# Patient Record
Sex: Male | Born: 2000
Health system: Southern US, Community
[De-identification: ages and names within clinical notes are randomized; demographics above are authoritative.]

## PROBLEM LIST (undated history)

## (undated) DIAGNOSIS — T7840XA Allergy, unspecified, initial encounter: Secondary | ICD-10-CM

## (undated) DIAGNOSIS — J45909 Unspecified asthma, uncomplicated: Secondary | ICD-10-CM

## (undated) HISTORY — DX: Unspecified asthma, uncomplicated: J45.909

## (undated) HISTORY — DX: Allergy, unspecified, initial encounter: T78.40XA

---
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
## 2020-02-06 ENCOUNTER — Telehealth: Payer: Self-pay | Admitting: Pediatrics

## 2020-02-06 NOTE — Telephone Encounter (Signed)
Father asking for refill for inhaler since he will be gone out of the country till December. Just to hold him off until he returns.

## 2020-02-07 MED ORDER — ALBUTEROL SULFATE HFA 108 (90 BASE) MCG/ACT IN AERS: INHALATION_SPRAY | RESPIRATORY_TRACT | 12 refills | Status: DC

## 2020-02-07 NOTE — Telephone Encounter (Signed)
Refilled Allergy/asthma medications 

## 2020-02-18 ENCOUNTER — Telehealth: Payer: Self-pay | Admitting: Pediatrics

## 2020-02-24 ENCOUNTER — Ambulatory Visit (HOSPITAL_COMMUNITY)
Admission: EM | Admit: 2020-02-24 | Discharge: 2020-02-24 | Disposition: A | Discharge status: Home / Self Care | Payer: BC Managed Care – PPO | Attending: Urgent Care | Admitting: Urgent Care

## 2020-02-24 ENCOUNTER — Other Ambulatory Visit: Payer: Self-pay

## 2020-02-24 DIAGNOSIS — Z20822 Contact with and (suspected) exposure to covid-19: Secondary | ICD-10-CM | POA: Insufficient documentation

## 2020-02-24 DIAGNOSIS — Z0189 Encounter for other specified special examinations: Secondary | ICD-10-CM | POA: Diagnosis not present

## 2020-02-24 NOTE — Discharge Instructions (Signed)

## 2020-02-24 NOTE — ED Triage Notes (Signed)
Pt presents for covid test for travel. Denies any symptoms.

## 2020-02-25 LAB — SARS CORONAVIRUS 2 (TAT 6-24 HRS): SARS Coronavirus 2: NEGATIVE

## 2020-07-22 NOTE — Telephone Encounter (Signed)
Open in error

## 2020-07-30 IMAGING — CR DG CHEST 2V
2 series · 2 of 2 positions shown · non-contrast
Comparison: 06/06/2018

CLINICAL DATA: Recent pneumonia.  Evaluate for resolution

EXAM:
CHEST - 2 VIEW

[w chest pa 8-[id] (15-22cm)]
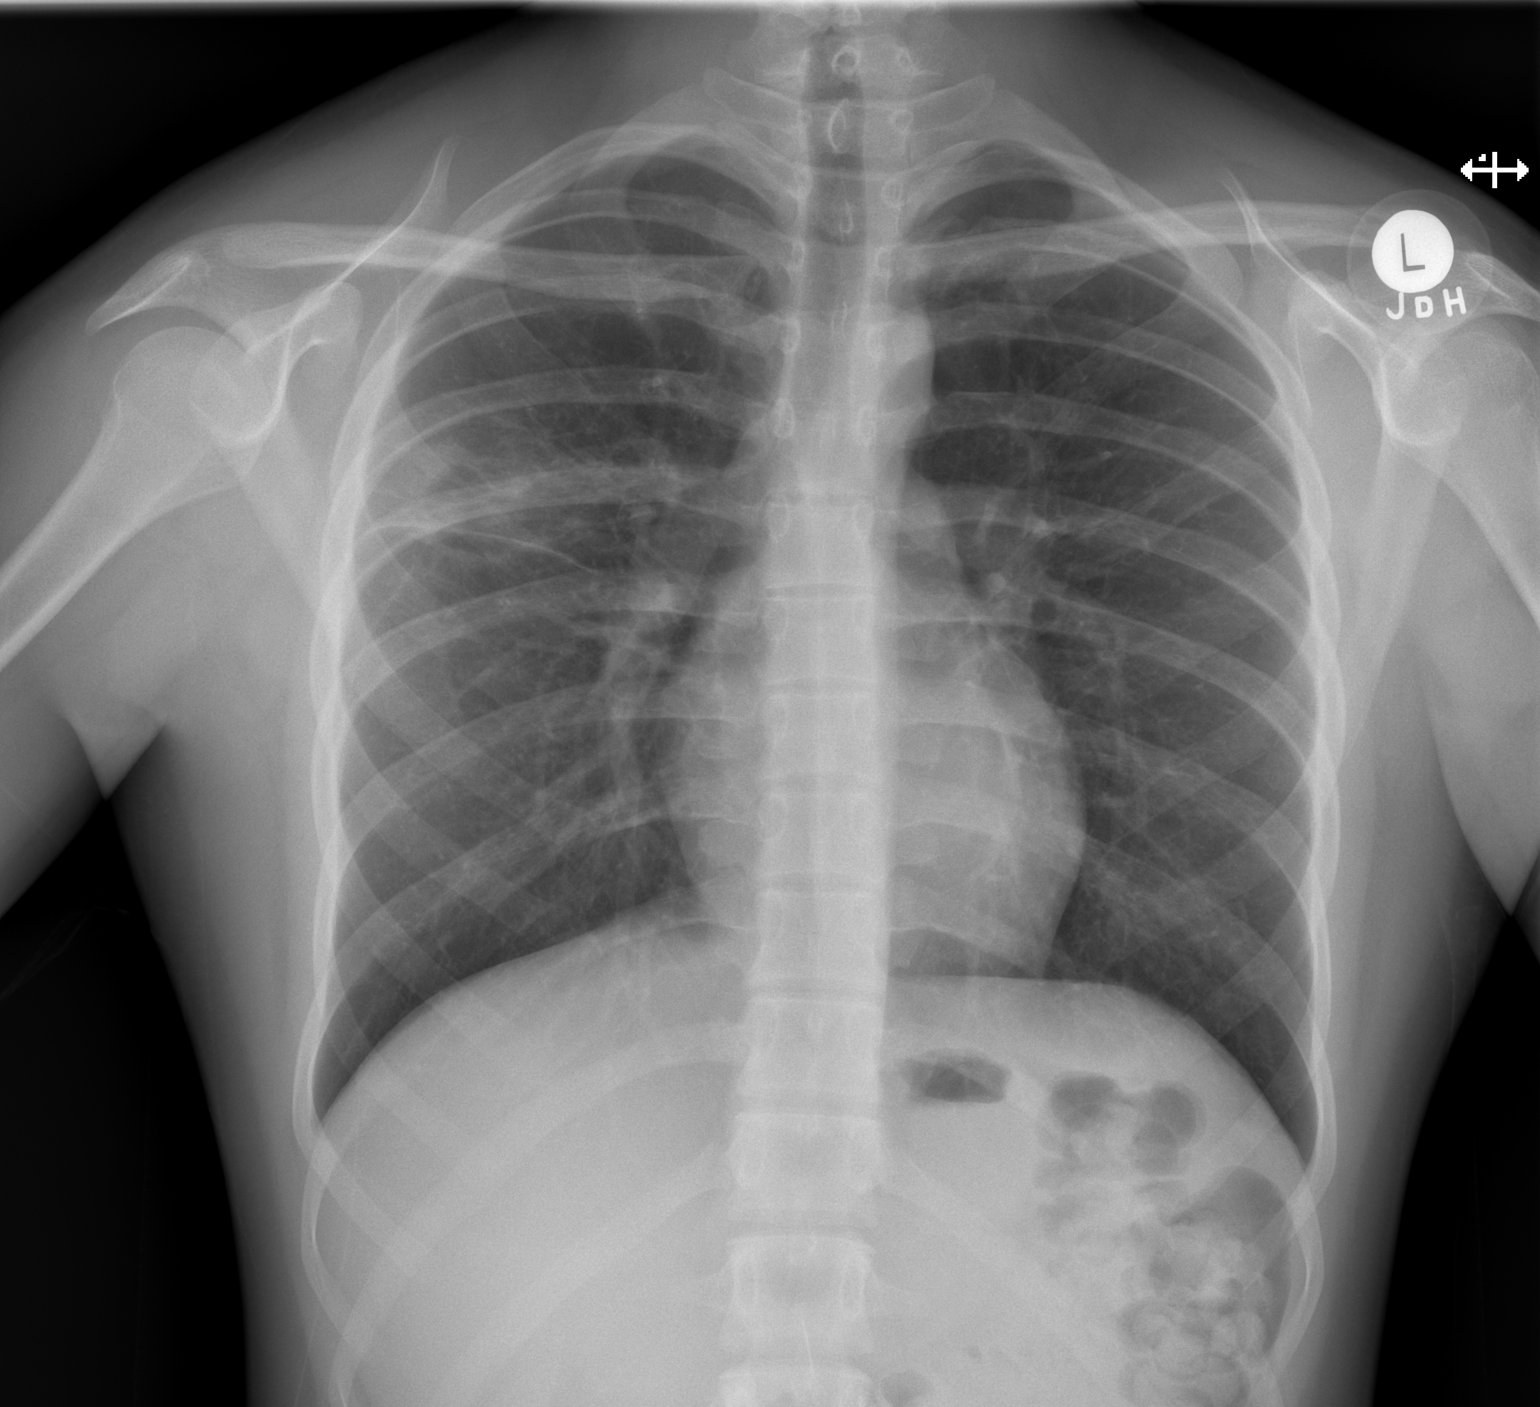

[w chest lat 8-[id] (21-28cm)]
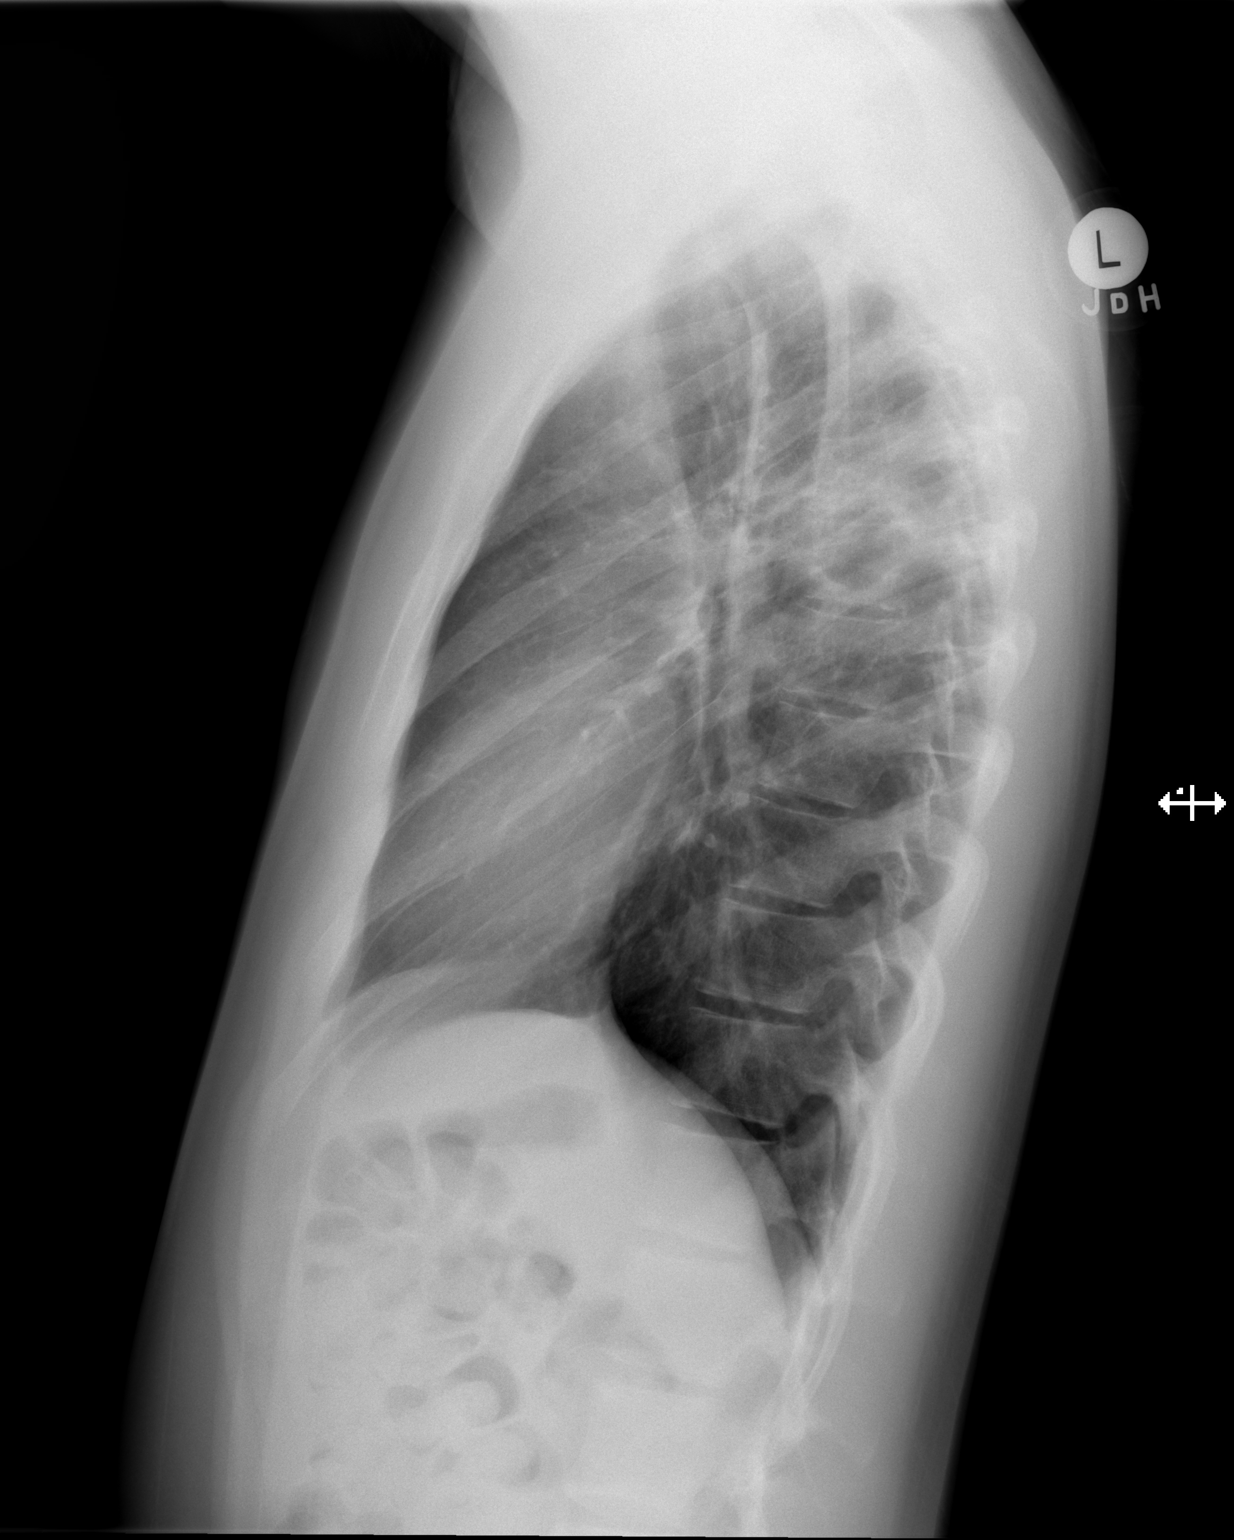

[2 of 2 positions shown; findings below may reference images not displayed]

FINDINGS: Improvement in the right upper lobe opacity since prior study. There
may be a small air-fluid level remaining suggesting abscess or
cavitation. Left lung clear. Heart is normal size. No effusions or
acute bony abnormality.
IMPRESSION: Improving consolidation in the right upper lobe with mild residual
airspace disease. Short air-fluid level remains which may reflect
small residual abscess or cavitation.

## 2020-11-13 ENCOUNTER — Other Ambulatory Visit: Payer: Self-pay

## 2020-11-13 ENCOUNTER — Ambulatory Visit (INDEPENDENT_AMBULATORY_CARE_PROVIDER_SITE_OTHER): Payer: BC Managed Care – PPO | Admitting: Pediatrics

## 2020-11-13 ENCOUNTER — Encounter: Payer: Self-pay | Admitting: Pediatrics

## 2020-11-13 VITALS — Temp 98.6°F | Wt 154.0 lb

## 2020-11-13 DIAGNOSIS — J3081 Allergic rhinitis due to animal (cat) (dog) hair and dander: Secondary | ICD-10-CM | POA: Diagnosis not present

## 2020-11-13 DIAGNOSIS — R062 Wheezing: Secondary | ICD-10-CM | POA: Diagnosis not present

## 2020-11-13 MED ORDER — ALBUTEROL SULFATE HFA 108 (90 BASE) MCG/ACT IN AERS: INHALATION_SPRAY | RESPIRATORY_TRACT | 12 refills | Status: DC

## 2020-11-13 NOTE — Patient Instructions (Signed)
Use Flonase nasal spray saily Zyrtec daily while in the same house as a dog Albuterol- 1 to 2 puffs every 4 to 6 hours as needed, USE SPACER CHAMBER EVERY TIME

## 2020-11-13 NOTE — Progress Notes (Signed)
Subjective:     Jackson Reyes is a 20 y.o. male who presents for evaluation and treatment of allergic symptoms. Symptoms include: clear rhinorrhea, cough, itchy eyes, itchy nose, postnasal drip, watery eyes and wheezing and are not present in a seasonal pattern. Precipitants include: dog dander. Treatment currently includes none and is not effective. The following portions of the patient's history were reviewed and updated as appropriate: allergies, current medications, past family history, past medical history, past social history, past surgical history and problem list.  Review of Systems Pertinent items are noted in HPI.    Objective:    Temp 98.6 F (37 C)   Wt 154 lb (69.9 kg)  General appearance: alert, cooperative, appears stated age and no distress Head: Normocephalic, without obvious abnormality, atraumatic Eyes: conjunctivae/corneas clear. PERRL, EOM's intact. Fundi benign. Ears: normal TM's and external ear canals both ears Nose: moderate congestion, turbinates pink, pale, swollen Throat: lips, mucosa, and tongue normal; teeth and gums normal Neck: no adenopathy, no carotid bruit, no JVD, supple, symmetrical, trachea midline and thyroid not enlarged, symmetric, no tenderness/mass/nodules Lungs: clear to auscultation bilaterally Heart: regular rate and rhythm, S1, S2 normal, no murmur, click, rub or gallop    Assessment:    Allergic rhinitis due to dog dander   Plan:    Medications: oral antihistamines: Cetirizine, beta-agonist inhalers:  Albuterol MDI with spacer chamber. Allergen avoidance discussed. Follow-up as needed

## 2020-11-27 ENCOUNTER — Ambulatory Visit: Payer: BC Managed Care – PPO | Admitting: Pediatrics

## 2020-11-27 ENCOUNTER — Telehealth: Payer: Self-pay | Admitting: Pediatrics

## 2020-11-27 DIAGNOSIS — Z00129 Encounter for routine child health examination without abnormal findings: Secondary | ICD-10-CM

## 2020-11-27 NOTE — Telephone Encounter (Signed)
Safi called and said something came up and he needed to reschedule. Rescheduled the appointment.  Parent informed of No Show Policy. No Show Policy states that a patient may be dismissed from the practice after 3 missed well check appointments in a rolling calendar year. No show appointments are well child check appointments that are missed (no show or cancelled/rescheduled < 24hrs prior to appointment). The parent(s)/guardian will be notified of each missed appointment. The office administrator will review the chart prior to a decision being made. If a patient is dismissed due to No Shows, [facility] will continue to see that patient for 30 days for sick visits. Parent/caregiver verbalized understanding of policy.

## 2020-12-23 ENCOUNTER — Ambulatory Visit: Payer: BC Managed Care – PPO | Admitting: Pediatrics

## 2020-12-23 DIAGNOSIS — Z00129 Encounter for routine child health examination without abnormal findings: Secondary | ICD-10-CM

## 2021-02-12 ENCOUNTER — Other Ambulatory Visit: Payer: Self-pay

## 2021-02-12 ENCOUNTER — Ambulatory Visit (INDEPENDENT_AMBULATORY_CARE_PROVIDER_SITE_OTHER): Payer: BC Managed Care – PPO | Admitting: Pediatrics

## 2021-02-12 ENCOUNTER — Encounter: Payer: Self-pay | Admitting: Pediatrics

## 2021-02-12 VITALS — BP 120/80 | Temp 98.4°F | Ht 65.25 in | Wt 161.8 lb

## 2021-02-12 DIAGNOSIS — J4540 Moderate persistent asthma, uncomplicated: Secondary | ICD-10-CM | POA: Diagnosis not present

## 2021-02-12 DIAGNOSIS — Z68.41 Body mass index (BMI) pediatric, 5th percentile to less than 85th percentile for age: Secondary | ICD-10-CM

## 2021-02-12 DIAGNOSIS — Z23 Encounter for immunization: Secondary | ICD-10-CM

## 2021-02-12 DIAGNOSIS — Z0001 Encounter for general adult medical examination with abnormal findings: Secondary | ICD-10-CM | POA: Diagnosis not present

## 2021-02-12 DIAGNOSIS — Z00129 Encounter for routine child health examination without abnormal findings: Secondary | ICD-10-CM

## 2021-02-12 MED ORDER — ALBUTEROL SULFATE 108 (90 BASE) MCG/ACT IN AEPB: 2.0000 | INHALATION_SPRAY | RESPIRATORY_TRACT | 6 refills | Status: DC | PRN

## 2021-02-12 NOTE — Patient Instructions (Signed)
Well Child Nutrition, Young Adult This sheet provides general nutrition recommendations. Talk with a health care provider or a diet and nutrition specialist (dietitian) if you have anyquestions. Nutrition The amount of food you need to eat every day depends on your age, sex, size, and activity level. To figure out your daily calorie needs, look for a caloriecalculator online or talk with your health care provider. Balanced diet Eat a balanced diet. Try to include: Fruits. Aim for 2 cups a day. Examples of 1 cup of fruit include 1 large banana, 1 small apple, 8 large strawberries, or 1 large orange. Eat a variety of whole fruits and 100% fruit juice. Choose fresh, canned, frozen, or dried forms. Choose canned fruit that has the lowest added sugar or no added sugar. Vegetables. Aim for 2-3 cups a day. Examples of 1 cup of vegetables include 2 medium carrots, 1 large tomato, or 2 stalks of celery. Choose fresh, frozen, canned, and dried options. Eat vegetables of a variety of colors. Low-fat dairy. Aim for 3 cups a day. Examples of 1 cup of dairy include 8 oz (230 mL) of milk, 8 oz (230 g) of yogurt, or 1 oz (44 g) of natural cheese. Choose fat-free or low-fat dairy products, including milk, yogurt, and cheese. If you are unable to tolerate dairy (lactose intolerant) or you choose not to consume dairy, you may include fortified soy beverages (soy milk). Whole grains. Of the grain foods that you eat each day (such as pasta, rice, and tortillas), aim to include 6-8 "ounce-equivalents" of whole-grain options. Examples of 1 ounce-equivalent of whole grains include 1 cup of whole-wheat cereal,  cup of brown rice, or 1 slice of whole-wheat bread. Try to choose whole grains including brown rice, wild rice, quinoa, and oats. Lean proteins. Aim for 5-6 "ounce-equivalents" a day. Eat a variety of protein foods, including lean meats, seafood, poultry, eggs, legumes (beans and peas), nuts, seeds, and soy  products. A cut of meat or fish that is the size of a deck of cards is about 3-4 ounce-equivalents. Foods that provide 1 ounce-equivalent of protein include 1 egg,  cup of nuts or seeds, or 1 tablespoon (16 g) of peanut butter. For more information and options for foods in a balanced diet, visit www.DisposableNylon.be Tips for healthy snacking A snack should not be the size of a full meal. Eat snacks that have 200 calories or less. Examples include:  whole-wheat pita with  cup hummus. 2 or 3 slices of deli [place] wrapped around a cheese stick.  apple with 1 tablespoon of peanut butter. 10 baked chips with salsa. Keep cut-up fruits and vegetables available at home and at school so they are easy to eat. Pack healthy snacks the night before or when you pack your lunch. Avoid pre-packaged foods. These tend to be higher in fat, sugar, and salt (sodium). Get involved with shopping, or ask the primary food shopper in your household to get healthy snacks that you like. Avoid chips, candy, cake, and soft drinks. Foods to avoid [place] or heavily processed foods, such as toaster pastries and microwaveable dinners. Drinks that contain a lot of sugar, such as sports drinks, sodas, and juice. Foods that contain a lot of fat, sodium, or sugar. Food safety Prepare your food safely: Wash your hands after handling raw meats. Keep food preparation surfaces clean by washing them regularly with hot, soapy water. Keep raw meats separate from foods that are ready-to-eat, such as fruits and vegetables. Cook seafood, meat, poultry,  meat, poultry, and eggs to the recommended minimum safe internal temperature. Store foods at safe temperatures. In general: Keep cold foods at 40F (4C) or colder. Keep your freezer at 0F (-18C or 18 degrees below 0C) or colder. Keep hot foods at 140F (60C) or warmer. Foods are no longer safe to eat when they have been at a temperature of 40-140F (4-60C) for more than 2 hours. Physical  activity Try to get 150 minutes of moderate-intensity physical activity each week. Examples include walking briskly or bicycling slower than 10 miles an hour (16 km an hour). Do muscle-strengthening exercises on 2 or more days a week. If you find it difficult to fit regular physical activity into your schedule, try: Taking the stairs instead of the elevator. Parking your car farther from the entrance or at the back of the parking lot. Biking or walking to work or school. If you need to lose weight, you may need to reduce your daily calorie intake and increase your daily amount of physical activity. Check with your health care provider before you start a new diet and exercise plan. General instructions Do not skip meals, especially breakfast. Water is the ideal beverage. Aim to drink six 8-oz glasses of water each day. Avoid fad diets. These may affect your mood and growth. If you choose to consume alcohol: Drink in moderation. This means two drinks a day for men and one drink a day for nonpregnant women. One drink equals 12 oz of beer, 5 oz of wine, or 1 oz of hard liquor. You may drink coffee. It is recommended that you limit coffee intake to three to five 8-oz cups a day (up to 400 mg of caffeine). If you are worried about your body image, talk with your parents, your health care provider, or another trusted adult like a coach or counselor. You may be at risk for developing an eating disorder. Eating disorders can lead to serious medical problems. Food allergies may cause you to have a reaction (such as a rash, diarrhea, or vomiting) after eating or drinking. Talk with your health care provider if you have concerns about food allergies. Summary Eat a balanced diet. Include fruits, vegetables, low-fat dairy, whole grains, and lean proteins. Try to get 150 minutes of moderate-intensity physical activity each week, and do muscle-strengthening exercises on 2 or more days a week. Choose healthy  snacks that are 200 calories or less. Drink plenty of water. Try to drink six 8-oz glasses a day. This information is not intended to replace advice given to you by your health care provider. Make sure you discuss any questions you have with your health care provider. Document Revised: 06/04/2020 Document Reviewed: 06/04/2020 Elsevier Patient Education  2022 [employer]  

## 2021-02-12 NOTE — Progress Notes (Signed)
Adolescent Well Care Visit Jackson Reyes is a 20 y.o. male who is here for well care.    PCP:  Georgiann Hahn, MD   History was provided by the patient.  Confidentiality was discussed with the patient and, if applicable, with caregiver as well.   Current Issues: Current concerns include:  needs refill with albuterol.  Reports some frequent needing it with allergies.  --history of asthma.  Triggers:  activity, seasonal allergies, colds  Nutrition: Nutrition/Eating Behaviors: good eater, 2 meals/day plus snacks, all food groups, mainly drinks water, occasional sweet drinks Adequate calcium in diet?: limited Supplements/ Vitamins: none  Exercise/ Media: Play any Sports?/ Exercise: soccer, couple times Screen Time:  < 2 hours Media Rules or Monitoring?: no  Sleep:  Sleep: 8hrs  Social Screening: Lives with:  roomate Stressors of note: no  Education: School Name: [employer] Grade: sophmore School performance: doing well; no concerns School Behavior: doing well; no concerns  Menstruation:   No LMP for male patient. Menstrual History: NA   Confidential Social History: Tobacco?  no Secondhand smoke exposure?  no Drugs/ETOH?  no Sexually Active?  No, denies, dexlines testing Pregnancy Prevention: discussed  Safe at home, in school & in relationships?  Yes Safe to self?  Yes   Screenings: Patient has a dental home: yes, has dentist, brush bid  eating habits, exercise habits, other substance use, and mental health.  Issues were addressed and counseling provided.  Additional topics were addressed as anticipatory guidance.  PHQ-9 completed and results indicated no conerns  Physical Exam:  Vitals:   02/12/21 1217  BP: 120/80  Temp: 98.4 F (36.9 C)  Weight: 161 lb 12.8 oz (73.4 kg)  Height: 5' 5.25" (1.657 m)   BP 120/80   Temp 98.4 F (36.9 C)   Ht 5' 5.25" (1.657 m)   Wt 161 lb 12.8 oz (73.4 kg)   BMI 26.72 kg/m  Body mass index: body mass  index is 26.72 kg/m. Blood pressure percentiles are not available for patients who are 18 years or older.  Hearing Screening   1000Hz  2000Hz  3000Hz  4000Hz   Right ear 20 20 20 20   Left ear 20 20 20 20    Vision Screening   Right eye Left eye Both eyes  Without correction 10/10 10/10   With correction       General Appearance:   alert, oriented, no acute distress and well nourished  HENT: Normocephalic, no obvious abnormality, conjunctiva clear  Mouth:   Normal appearing teeth, no obvious discoloration, dental caries, or dental caps  Neck:   Supple; thyroid: no enlargement, symmetric, no tenderness/mass/nodules  Chest Normal male  Lungs:   Clear to auscultation bilaterally, normal work of breathing  Heart:   Regular rate and rhythm, S1 and S2 normal, no murmurs;   Abdomen:   Soft, non-tender, no mass, or organomegaly  GU normal male genitals, no testicular masses or hernia, Tanner stage 5  Musculoskeletal:   Tone and strength strong and symmetrical, all extremities      no scoliosis         Lymphatic:   No cervical adenopathy  Skin/Hair/Nails:   Skin warm, dry and intact, no rashes, no bruises or petechiae  Neurologic:   Strength, gait, and coordination normal and age-appropriate     Assessment and Plan:   1. Encounter for routine child health examination without abnormal findings   2. BMI (body mass index), pediatric, 5% to less than 85% for age   38. Moderate  persistent asthma without complication    --start back on zyrtec to see if asthma is better controlled.  If no decrease in albuterol use will need to restart flovent.  Contact back if still requiring frequent albuterol.  Has been on controller in past.   BMI is appropriate for age  Hearing screening result:normal Vision screening result: normal  Counseling provided for all of the vaccine components  Orders Placed This Encounter  Procedures   Meningococcal B, OMV (Bexsero)  --Indications, contraindications and side  effects of vaccine/vaccines discussed with parent and parent verbally expressed understanding and also agreed with the administration of vaccine/vaccines as ordered above  today.    Return in about 1 year (around 02/12/2022).Marland Kitchen  Myles Gip, DO

## 2021-02-16 ENCOUNTER — Encounter: Payer: Self-pay | Admitting: Pediatrics

## 2021-06-15 ENCOUNTER — Telehealth: Payer: Self-pay | Admitting: Pediatrics

## 2021-06-15 DIAGNOSIS — J9801 Acute bronchospasm: Secondary | ICD-10-CM

## 2021-06-15 MED ORDER — HYDROXYZINE HCL 25 MG PO TABS: 25.0000 mg | ORAL_TABLET | Freq: Two times a day (BID) | ORAL | 0 refills | Status: AC

## 2021-06-15 NOTE — Telephone Encounter (Signed)
Patient called stating that he thought he had the flu last Thursday. Since then, most of his symptoms have subsided but he still has a "mucus-y cough", and some lightlessness. Patient is wondering if he should be seen or if there is anything you would recommend to help with symptoms.    Dennison Bulla [phone]   [facility]

## 2021-06-15 NOTE — Telephone Encounter (Signed)
Started on hydroxyzine and sent for Chest X ray to rule out infection

## 2021-06-16 ENCOUNTER — Other Ambulatory Visit: Payer: Self-pay

## 2021-06-16 ENCOUNTER — Ambulatory Visit
Admission: RE | Admit: 2021-06-16 | Discharge: 2021-06-16 | Disposition: A | Discharge status: Home / Self Care | Payer: BLUE CROSS/BLUE SHIELD | Source: Ambulatory Visit | Attending: Pediatrics | Admitting: Pediatrics

## 2021-06-16 DIAGNOSIS — J45909 Unspecified asthma, uncomplicated: Secondary | ICD-10-CM | POA: Diagnosis not present

## 2021-06-16 DIAGNOSIS — J9801 Acute bronchospasm: Secondary | ICD-10-CM

## 2021-12-23 ENCOUNTER — Ambulatory Visit (INDEPENDENT_AMBULATORY_CARE_PROVIDER_SITE_OTHER): Payer: BC Managed Care – PPO | Admitting: Pediatrics

## 2021-12-23 ENCOUNTER — Encounter: Payer: Self-pay | Admitting: Pediatrics

## 2021-12-23 VITALS — Wt 157.9 lb

## 2021-12-23 DIAGNOSIS — W450XXA Nail entering through skin, initial encounter: Secondary | ICD-10-CM | POA: Diagnosis not present

## 2021-12-23 DIAGNOSIS — Z23 Encounter for immunization: Secondary | ICD-10-CM | POA: Insufficient documentation

## 2021-12-23 DIAGNOSIS — S91331A Puncture wound without foreign body, right foot, initial encounter: Secondary | ICD-10-CM

## 2021-12-23 NOTE — Patient Instructions (Signed)
Tdap (Tetanus, Diphtheria, Pertussis) Vaccine: What You Need to Know 1. Why get vaccinated? Tdap vaccine can prevent tetanus, diphtheria, and pertussis. Diphtheria and pertussis spread from person to person. Tetanus enters the body through cuts or wounds. TETANUS (T) causes painful stiffening of the muscles. Tetanus can lead to serious health problems, including being unable to open the mouth, having trouble swallowing and breathing, or death. DIPHTHERIA (D) can lead to difficulty breathing, heart failure, paralysis, or death. PERTUSSIS (aP), also known as "whooping cough," can cause uncontrollable, violent coughing that makes it hard to breathe, eat, or drink. Pertussis can be extremely serious especially in babies and young children, causing pneumonia, convulsions, brain damage, or death. In teens and adults, it can cause weight loss, loss of bladder control, passing out, and rib fractures from severe coughing. 2. Tdap vaccine Tdap is only for children 7 years and older, adolescents, and adults.  Adolescents should receive a single dose of Tdap, preferably at age 11 or 12 years. Pregnant people should get a dose of Tdap during every pregnancy, preferably during the early part of the third trimester, to help protect the newborn from pertussis. Infants are most at risk for severe, life-threatening complications from pertussis. Adults who have never received Tdap should get a dose of Tdap. Also, adults should receive a booster dose of either Tdap or Td (a different vaccine that protects against tetanus and diphtheria but not pertussis) every 10 years, or after 5 years in the case of a severe or dirty wound or burn. Tdap may be given at the same time as other vaccines. 3. Talk with your health care provider Tell your vaccine provider if the person getting the vaccine: Has had an allergic reaction after a previous dose of any vaccine that protects against tetanus, diphtheria, or pertussis, or has any  severe, life-threatening allergies Has had a coma, decreased level of consciousness, or prolonged seizures within 7 days after a previous dose of any pertussis vaccine (DTP, DTaP, or Tdap) Has seizures or another nervous system problem Has ever had Guillain-Barr Syndrome (also called "GBS") Has had severe pain or swelling after a previous dose of any vaccine that protects against tetanus or diphtheria In some cases, your health care provider may decide to postpone Tdap vaccination until a future visit. People with minor illnesses, such as a cold, may be vaccinated. People who are moderately or severely ill should usually wait until they recover before getting Tdap vaccine.  Your health care provider can give you more information. 4. Risks of a vaccine reaction Pain, redness, or swelling where the shot was given, mild fever, headache, feeling tired, and nausea, vomiting, diarrhea, or stomachache sometimes happen after Tdap vaccination. People sometimes faint after medical procedures, including vaccination. Tell your provider if you feel dizzy or have vision changes or ringing in the ears.  As with any medicine, there is a very remote chance of a vaccine causing a severe allergic reaction, other serious injury, or death. 5. What if there is a serious problem? An allergic reaction could occur after the vaccinated person leaves the clinic. If you see signs of a severe allergic reaction (hives, swelling of the face and throat, difficulty breathing, a fast heartbeat, dizziness, or weakness), call 9-1-1 and get the person to the nearest hospital. For other signs that concern you, call your health care provider.  Adverse reactions should be reported to the Vaccine Adverse Event Reporting System (VAERS). Your health care provider will usually file this report, or you   can do it yourself. Visit the VAERS website at www.vaers.hhs.gov or call 1-800-822-7967. VAERS is only for reporting reactions, and VAERS staff  members do not give medical advice. 6. The National Vaccine Injury Compensation Program The National Vaccine Injury Compensation Program (VICP) is a federal program that was created to compensate people who may have been injured by certain vaccines. Claims regarding alleged injury or death due to vaccination have a time limit for filing, which may be as short as two years. Visit the VICP website at www.hrsa.gov/vaccinecompensation or call 1-800-338-2382 to learn about the program and about filing a claim. 7. How can I learn more? Ask your health care provider. Call your local or state health department. Visit the website of the Food and Drug Administration (FDA) for vaccine package inserts and additional information at www.fda.gov/vaccines-blood-biologics/vaccines. Contact the Centers for Disease Control and Prevention (CDC): Call 1-800-232-4636 (1-800-CDC-INFO) or Visit CDC's website at www.cdc.gov/vaccines. Source: CDC Vaccine Information Statement Tdap (Tetanus, Diphtheria, Pertussis) Vaccine (02/01/2020) This same material is available at www.cdc.gov for no charge. This information is not intended to replace advice given to you by your health care provider. Make sure you discuss any questions you have with your health care provider. Document Revised: 05/13/2021 Document Reviewed: 03/16/2021 Elsevier Patient Education  2023 Elsevier Inc.  

## 2021-12-23 NOTE — Progress Notes (Addendum)
Subjective:      History was provided by the patient.  Jackson Reyes is a 21 y.o. male here for chief complaint of stepping on nail with R foot.  Patient stepped on rusty nail earlier this afternoon without breakage to skin on ball of R foot. No bleeding, swelling, discharge from area. No fevers. Denies pain or limited range of motion to the area. No known drug allergies. No known sick contacts. Last Tdap 2014.   The following portions of the patient's history were reviewed and updated as appropriate: allergies, current medications, past family history, past medical history, past social history, past surgical history, and problem list.  Review of Systems All pertinent information noted in the HPI.  Objective:  There were no vitals taken for this visit. General:   alert, cooperative, and appears stated age  Neck:  Negative for cervical anterior and posterior lymphadenopathyno adenopathy, no carotid bruit, no JVD, supple, symmetrical, trachea midline, and thyroid not enlarged, symmetric, no tenderness/mass/nodules  Thyroid:   no palpable nodule  Lung:  clear to auscultation bilaterally  Heart:   regular rate and rhythm, S1, S2 normal, no murmur, click, rub or gallop  Abdomen:  soft, non-tender; bowel sounds normal; no masses,  no organomegaly  Extremities:  extremities normal, atraumatic, no cyanosis or edema  Skin:  warm and dry, no hyperpigmentation, vitiligo, or suspicious lesions. Small erythematous area to sole of right foot without broken skin, bleeding, discharge, edema.  *Patient unable to pinpoint where contact with the nail was*  Neurological:   negative  Psychiatric:   normal mood, behavior, speech, dress, and thought processes   Assessment:   Injury by nail, initial encounter  Plan:   Orders Placed This Encounter  Procedures   Tdap vaccine greater than or equal to 7yo IM  Indications, contraindications and side effects of vaccine/vaccines discussed with patient and  patient verbally expressed understanding and also agreed with the administration of vaccine/vaccines as ordered above today.Handout (VIS) given for each vaccine at this visit. No signs of infection at this time. No need for antibiotics due to nail not breaking skin. Gave return precautions and instructions on what to be looking out for in terms of infection.  Supportive care discussed -Return precautions discussed. Return if symptoms worsen or fail to improve.  Harrell Gave, NP  12/23/21

## 2022-01-13 ENCOUNTER — Encounter: Payer: Self-pay | Admitting: Pediatrics

## 2022-01-13 MED ORDER — ERYTHROMYCIN 5 MG/GM OP OINT: 1.0000 | TOPICAL_OINTMENT | Freq: Three times a day (TID) | OPHTHALMIC | 3 refills | Status: AC

## 2022-01-13 MED ORDER — CEPHALEXIN 500 MG PO CAPS: 500.0000 mg | ORAL_CAPSULE | Freq: Two times a day (BID) | ORAL | 0 refills | Status: AC

## 2022-02-08 ENCOUNTER — Encounter: Payer: Self-pay | Admitting: Pediatrics

## 2022-02-24 ENCOUNTER — Telehealth: Payer: Self-pay | Admitting: Pediatrics

## 2022-02-24 DIAGNOSIS — Z20822 Contact with and (suspected) exposure to covid-19: Secondary | ICD-10-CM | POA: Diagnosis not present

## 2022-02-24 DIAGNOSIS — Z6823 Body mass index (BMI) 23.0-23.9, adult: Secondary | ICD-10-CM | POA: Diagnosis not present

## 2022-02-24 DIAGNOSIS — Z03818 Encounter for observation for suspected exposure to other biological agents ruled out: Secondary | ICD-10-CM | POA: Diagnosis not present

## 2022-02-24 MED ORDER — ALBUTEROL SULFATE 108 (90 BASE) MCG/ACT IN AEPB: 2.0000 | INHALATION_SPRAY | RESPIRATORY_TRACT | 6 refills | Status: DC | PRN

## 2022-02-24 MED ORDER — ALBUTEROL SULFATE 108 (90 BASE) MCG/ACT IN AEPB: 2.0000 | INHALATION_SPRAY | RESPIRATORY_TRACT | 2 refills | Status: DC | PRN

## 2022-02-24 NOTE — Telephone Encounter (Signed)
Albuterol sent to pharmacy. 

## 2022-02-24 NOTE — Telephone Encounter (Signed)
Jackson Reyes called and stated that he needs a refill on his inhaler. Jackson Reyes is in college and requested to have it sent to [facility] [address] in [place]

## 2022-04-03 ENCOUNTER — Encounter: Payer: Self-pay | Admitting: Pediatrics

## 2022-04-03 ENCOUNTER — Telehealth: Payer: Self-pay

## 2022-04-03 MED ORDER — ALBUTEROL SULFATE 108 (90 BASE) MCG/ACT IN AEPB: 2.0000 | INHALATION_SPRAY | RESPIRATORY_TRACT | 0 refills | Status: DC | PRN

## 2022-04-03 NOTE — Telephone Encounter (Signed)
Jackson Reyes called in requesting a refill of albuterol prescription, stated that he does have the one he was prescribed but states that when he comes home, "I don't think my body is use to dog hair anymore."  Causing him to use inhaler more often and feels as if it is not working, so is asking for a refill or thinking it could be a faulty inhaler.   Patient leaves back for [place] after this weekend due to school.   Best pharmacy for 04/03/22: [facility] -- [place], [place], [place] [address]  Sent to on-call provider

## 2022-04-03 NOTE — Telephone Encounter (Signed)
Jackson Reyes is in town from [place] and reports albuterol inhaler case is broken and does not function.  Reports at home there are dogs and usually exacerbate his symptoms and needs refill.  Will send refill to pharmacy of choice.

## 2022-05-11 ENCOUNTER — Encounter: Payer: Self-pay | Admitting: Pediatrics

## 2022-05-19 ENCOUNTER — Ambulatory Visit (INDEPENDENT_AMBULATORY_CARE_PROVIDER_SITE_OTHER): Payer: BC Managed Care – PPO | Admitting: Pediatrics

## 2022-05-19 ENCOUNTER — Encounter: Payer: Self-pay | Admitting: Pediatrics

## 2022-05-19 VITALS — BP 120/84 | Ht 66.25 in | Wt 178.4 lb

## 2022-05-19 DIAGNOSIS — Z68.41 Body mass index (BMI) pediatric, 5th percentile to less than 85th percentile for age: Secondary | ICD-10-CM

## 2022-05-19 DIAGNOSIS — Z0001 Encounter for general adult medical examination with abnormal findings: Secondary | ICD-10-CM

## 2022-05-19 DIAGNOSIS — Z1339 Encounter for screening examination for other mental health and behavioral disorders: Secondary | ICD-10-CM

## 2022-05-19 DIAGNOSIS — Z23 Encounter for immunization: Secondary | ICD-10-CM

## 2022-05-19 DIAGNOSIS — M67432 Ganglion, left wrist: Secondary | ICD-10-CM | POA: Diagnosis not present

## 2022-05-19 DIAGNOSIS — Z00129 Encounter for routine child health examination without abnormal findings: Secondary | ICD-10-CM

## 2022-05-24 ENCOUNTER — Encounter: Payer: Self-pay | Admitting: Pediatrics

## 2022-05-24 DIAGNOSIS — Z00129 Encounter for routine child health examination without abnormal findings: Secondary | ICD-10-CM | POA: Insufficient documentation

## 2022-05-24 DIAGNOSIS — M67432 Ganglion, left wrist: Secondary | ICD-10-CM | POA: Insufficient documentation

## 2022-05-24 DIAGNOSIS — Z68.41 Body mass index (BMI) pediatric, 5th percentile to less than 85th percentile for age: Secondary | ICD-10-CM | POA: Insufficient documentation

## 2022-05-24 NOTE — Patient Instructions (Signed)
Preventive Care 18-21 Years Old, Male ?Preventive care refers to lifestyle choices and visits with your health care provider that can promote health and wellness. At this stage in your life, you may start seeing a primary care physician instead of a pediatrician for your preventive care. Preventive care visits are also called wellness exams. ?What can I expect for my preventive care visit? ?Counseling ?During your preventive care visit, your health care provider may ask about your: ?Medical history, including: ?Past medical problems. ?Family medical history. ?Current health, including: ?Home life and relationship well-being. ?Emotional well-being. ?Sexual activity and sexual health. ?Lifestyle, including: ?Alcohol, nicotine or tobacco, and drug use. ?Access to firearms. ?Diet, exercise, and sleep habits. ?Sunscreen use. ?Motor vehicle safety. ?Physical exam ?Your health care provider may check your: ?Height and weight. These may be used to calculate your BMI (body mass index). BMI is a measurement that tells if you are at a healthy weight. ?Waist circumference. This measures the distance around your waistline. This measurement also tells if you are at a healthy weight and may help predict your risk of certain diseases, such as type 2 diabetes and high blood pressure. ?Heart rate and blood pressure. ?Body temperature. ?Skin for abnormal spots. ?What immunizations do I need? ? ?Vaccines are usually given at various ages, according to a schedule. Your health care provider will recommend vaccines for you based on your age, medical history, and lifestyle or other factors, such as travel or where you work. ?What tests do I need? ?Screening ?Your health care provider may recommend screening tests for certain conditions. This may include: ?Vision and hearing tests. ?Lipid and cholesterol levels. ?Hepatitis B test. ?Hepatitis C test. ?HIV (human immunodeficiency virus) test. ?STI (sexually transmitted infection) testing, if  you are at risk. ?Tuberculosis skin test. ?Talk with your health care provider about your test results, treatment options, and if necessary, the need for more tests. ?Follow these instructions at home: ?Eating and drinking ? ?Eat a healthy diet that includes fresh fruits and vegetables, whole grains, lean protein, and low-fat dairy products. ?Drink enough fluid to keep your urine pale yellow. ?Do not drink alcohol if: ?Your health care provider tells you not to drink. ?You are under the legal drinking age. In the [place] the legal drinking age is 21. ?If you drink alcohol: ?Limit how much you have to 0-2 drinks a day. ?Know how much alcohol is in your drink. In the [place] one drink equals one 12 oz bottle of beer (355 mL), one 5 oz glass of wine (148 mL), or one 1? oz glass of hard liquor (44 mL). ?Lifestyle ?Brush your teeth every morning and night with fluoride toothpaste. Floss one time each day. ?Exercise for at least 30 minutes 5 or more days of the week. ?Do not use any products that contain nicotine or tobacco. These products include cigarettes, chewing tobacco, and vaping devices, such as e-cigarettes. If you need help quitting, ask your health care provider. ?Do not use drugs. ?If you are sexually active, practice safe sex. Use a condom or other form of protection to prevent STIs. ?Find healthy ways to manage stress, such as: ?Meditation, yoga, or listening to music. ?Journaling. ?Talking to a trusted person. ?Spending time with friends and family. ?Safety ?Always wear your seat belt while driving or riding in a vehicle. ?Do not drive: ?If you have been drinking alcohol. Do not ride with someone who has been drinking. ?When you are tired or distracted. ?While texting. ?If you have been using   any mind-altering substances or drugs. ?Wear a helmet and other protective equipment during sports activities. ?If you have firearms in your house, make sure you follow all gun safety procedures. ?Seek help if you have  been bullied, physically abused, or sexually abused. ?Use the internet responsibly to avoid dangers, such as online bullying and online sex predators. ?What's next? ?Go to your health care provider once a year for an annual wellness visit. ?Ask your health care provider how often you should have your eyes and teeth checked. ?Stay up to date on all vaccines. ?This information is not intended to replace advice given to you by your health care provider. Make sure you discuss any questions you have with your health care provider. ?Document Revised: 12/10/2020 Document Reviewed: 12/10/2020 ?Elsevier Patient Education ? 2023 Elsevier Inc. ? ?

## 2022-05-24 NOTE — Progress Notes (Signed)
Exercise --lifts weights  [phone]  Subjective:    Jackson Reyes is a 21 y.o. male who presents for Medicare Annual/Subsequent preventive examination.   Preventive Screening-Counseling & Management  Tobacco Social History   Tobacco Use  Smoking Status Never  Smokeless Tobacco Never    Current Problems (verified) Patient Active Problem List   Diagnosis Date Noted   Encounter for well child check without abnormal findings 05/24/2022   BMI (body mass index), pediatric, 5% to less than 85% for age 16/27/2023   Ganglion of forearm, left 05/24/2022    Medications Prior to Visit None   Current Medications (verified) Current Outpatient Medications  Medication Sig Dispense Refill   Albuterol Sulfate (PROAIR RESPICLICK) 350 (90 Base) MCG/ACT AEPB Inhale 2 puffs into the lungs every 4 (four) hours as needed. 1 each 0   No current facility-administered medications for this visit.     Allergies (verified) Other and Wheat bran   PAST HISTORY   Social History Social History   Tobacco Use   Smoking status: Never   Smokeless tobacco: Never  Substance Use Topics   Alcohol use: No    Are there smokers in your home (other than you)?  No  Risk Factors Current exercise habits: Gym/ health club routine includes jogging on track .  Dietary issues discussed: yes   Cardiac risk factors: none.  Depression Screen (Note: if answer to either of the following is "Yes", a more complete depression screening is indicated)   Q1: Over the past two weeks, have you felt down, depressed or hopeless? No  Q2: Over the past two weeks, have you felt little interest or pleasure in doing things? No  Have you lost interest or pleasure in daily life? No  Do you often feel hopeless? No  Do you cry easily over simple problems? No   Immunization History  Administered Date(s) Administered   DTaP 07/17/2001, 09/14/2001, 02/02/2002, 09/20/2002, 09/07/2006   HIB (PRP-OMP) 07/17/2001,  09/14/2001, 02/02/2002, 09/20/2002   HPV 9-valent 10/27/2017, 01/31/2018, 01/30/2019   Hepatitis A 09/01/2005, 04/13/2007   Hepatitis B 08-28-2000, 07/17/2001, 02/02/2002   IPV 07/17/2001, 09/14/2001, 02/02/2002, 09/07/2006   Influenza Nasal 02/17/2009, 05/18/2010, 05/06/2011, 04/12/2012   Influenza,Quad,Nasal, Live 03/15/2013, 07/15/2014   Influenza,inj,Quad PF,6+ Mos 05/28/2016, 06/01/2018, 07/02/2019, 05/19/2022   Influenza,inj,quad, With Preservative 05/20/2015   MMR 04/30/2002, 09/07/2006   Meningococcal B, OMV 02/12/2021, 05/19/2022   Meningococcal Conjugate 02/14/2013, 10/27/2017   Pneumococcal Conjugate-13 07/17/2001, 09/14/2001, 02/02/2002, 05/03/2003   Tdap 02/14/2013, 12/23/2021   Varicella 04/30/2002, 09/07/2006    Screening Tests Health Maintenance  Topic Date Due   Hepatitis C Screening  05/25/2023 (Originally 04/29/2019)   HIV Screening  05/25/2023 (Originally 04/28/2016)   INFLUENZA VACCINE  Completed   HPV VACCINES  Completed    All answers were reviewed with the patient and necessary referrals were made:  Marcha Solders, MD   05/24/2022   History reviewed: allergies, current medications, past family history, past medical history, past social history, past surgical history, and problem list  Review of Systems Pertinent items are noted in HPI.    Objective:     Vision by Snellen chart: right eye:20/20, left eye:20/20 Blood pressure 120/84, height 5' 6.25" (1.683 m), weight 178 lb 6.4 oz (80.9 kg). Body mass index is 28.58 kg/m.  BP 120/84   Ht 5' 6.25" (1.683 m)   Wt 178 lb 6.4 oz (80.9 kg)   BMI 28.58 kg/m  General appearance: alert and cooperative Head: Normocephalic, without obvious abnormality Eyes: negative Ears:  normal TM's and external ear canals both ears Nose: Nares normal. Septum midline. Mucosa normal. No drainage or sinus tenderness. Throat: lips, mucosa, and tongue normal; teeth and gums normal Neck: no adenopathy and supple,  symmetrical, trachea midline Back: no scoliosis present, symmetric, no curvature. ROM normal. No CVA tenderness. Lungs: clear to auscultation bilaterally Heart: regular rate and rhythm, S1, S2 normal, no murmur, click, rub or gallop Abdomen: soft, non-tender; bowel sounds normal; no masses,  no organomegaly Male genitalia: deferred Extremities: extremities normal, atraumatic, no cyanosis or edema and small mobile non tender cyst yo tendon of mid forearm Pulses: 2+ and symmetric Skin: Skin color, texture, turgor normal. No rashes or lesions Neurologic: Alert and oriented X 3, normal strength and tone. Normal symmetric reflexes. Normal coordination and gait     Assessment:     Well male   Patient Active Problem List   Diagnosis Date Noted   Encounter for well child check without abnormal findings 05/24/2022   BMI (body mass index), pediatric, 5% to less than 85% for age 70/27/2023   Ganglion of forearm, left 05/24/2022        Plan:     During the course of the visit the patient was educated and counseled about appropriate screening and preventive services including:   Influenza vaccine Men B vaccine   Patient Instructions (the written plan) was given to the patient.  Medicare Attestation I have personally reviewed: The patient's medical and social history Their use of alcohol, tobacco or illicit drugs Their current medications and supplements The patient's functional ability including ADLs,fall risks, home safety risks, cognitive, and hearing and visual impairment Diet and physical activities Evidence for depression or mood disorders  The patient's weight, height, BMI, and visual acuity have been recorded in the chart.  I have made referrals, counseling, and provided education to the patient based on review of the above and I have provided the patient with a written personalized care plan for preventive services.     Marcha Solders, MD   05/24/2022

## 2022-06-23 ENCOUNTER — Encounter: Payer: Self-pay | Admitting: Pediatrics

## 2022-06-23 ENCOUNTER — Telehealth: Payer: Self-pay | Admitting: Pediatrics

## 2022-06-23 MED ORDER — ALBUTEROL SULFATE 108 (90 BASE) MCG/ACT IN AEPB: 2.0000 | INHALATION_SPRAY | RESPIRATORY_TRACT | 12 refills | Status: DC | PRN

## 2022-06-23 NOTE — Telephone Encounter (Signed)
Father would like to get a refill on Albuterol inhaler.  Father is asking if there is a possibility of getting 2 inhaler.  [employer]

## 2022-06-23 NOTE — Telephone Encounter (Signed)
Refilled ASTHMA medications  

## 2022-09-03 ENCOUNTER — Telehealth: Payer: Self-pay

## 2022-09-03 MED ORDER — ALBUTEROL SULFATE 108 (90 BASE) MCG/ACT IN AEPB: 2.0000 | INHALATION_SPRAY | RESPIRATORY_TRACT | 12 refills | Status: AC | PRN

## 2022-09-03 NOTE — Telephone Encounter (Signed)
Refilled ASTHMA medications  

## 2022-09-03 NOTE — Telephone Encounter (Signed)
Jackson Reyes has reached out asking for a refill of Albuterol inhaler, states that he would like just a regular inhaler as last time he got one called it. He was not able to pick it up until a week later due to type of inhaler. Stated he is coming back down from school and the dog hairs at home are causing him to have slight reactions and left his inhaler so would need a refill. Has sent MyChart messages to provider. Called office and asked for a new message to be sent to PCP.   [facility]: [facility], [place], [place] [address]

## 2023-03-08 ENCOUNTER — Encounter: Payer: Self-pay | Admitting: Pediatrics

## 2023-07-14 ENCOUNTER — Other Ambulatory Visit: Payer: Self-pay | Admitting: Pediatrics

## 2023-12-20 ENCOUNTER — Encounter: Payer: Self-pay | Admitting: Pediatrics

## 2023-12-23 DIAGNOSIS — R109 Unspecified abdominal pain: Secondary | ICD-10-CM | POA: Diagnosis not present
